# Patient Record
Sex: Male | Born: 2000 | Race: White | Hispanic: Yes | Marital: Single | Smoking: Never smoker
Health system: Southern US, Community
[De-identification: ages and names within clinical notes are randomized; demographics above are authoritative.]

## PROBLEM LIST (undated history)

## (undated) HISTORY — PX: APPENDECTOMY: SHX54

---
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
## 2022-08-28 ENCOUNTER — Other Ambulatory Visit: Payer: Self-pay | Admitting: Internal Medicine

## 2022-08-28 DIAGNOSIS — R29898 Other symptoms and signs involving the musculoskeletal system: Secondary | ICD-10-CM

## 2022-09-02 ENCOUNTER — Encounter: Payer: Self-pay | Admitting: Family Medicine

## 2022-09-02 NOTE — Progress Notes (Signed)
Error  Trying to get Video visit with [facility] health care after a visit with them last week.  Will call them to get set up.  Patient acknowledged agreement and understanding of the plan.

## 2022-09-17 ENCOUNTER — Other Ambulatory Visit: Payer: Self-pay | Admitting: Internal Medicine

## 2022-09-17 ENCOUNTER — Ambulatory Visit
Admission: RE | Admit: 2022-09-17 | Discharge: 2022-09-17 | Disposition: A | Discharge status: Home / Self Care | Payer: Worker's Compensation | Source: Ambulatory Visit | Attending: Internal Medicine | Admitting: Internal Medicine

## 2022-09-17 ENCOUNTER — Ambulatory Visit
Admission: RE | Admit: 2022-09-17 | Discharge: 2022-09-17 | Disposition: A | Discharge status: Home / Self Care | Payer: MEDICAID | Source: Ambulatory Visit | Attending: Internal Medicine | Admitting: Internal Medicine

## 2022-09-17 DIAGNOSIS — R29898 Other symptoms and signs involving the musculoskeletal system: Secondary | ICD-10-CM

## 2022-09-17 DIAGNOSIS — S00459A Superficial foreign body of unspecified ear, initial encounter: Secondary | ICD-10-CM

## 2022-09-17 MED ORDER — GADOPICLENOL 0.5 MMOL/ML IV SOLN
5.5000 mL | Freq: Once | INTRAVENOUS | Status: AC | PRN
  Administered 2022-09-17: 5.5 mL via INTRAVENOUS

## 2022-10-08 ENCOUNTER — Telehealth: Payer: Self-pay | Admitting: Family Medicine

## 2022-10-08 DIAGNOSIS — R29898 Other symptoms and signs involving the musculoskeletal system: Secondary | ICD-10-CM

## 2022-10-08 NOTE — Progress Notes (Signed)
Manuel [name]   Is followed by [facility] and was trying to make appt with them. Will reach out to them, numbers in chart given  Patient acknowledged agreement and understanding of the plan.

## 2022-11-04 ENCOUNTER — Other Ambulatory Visit: Payer: Self-pay | Admitting: Neurological Surgery

## 2022-11-04 DIAGNOSIS — G950 Syringomyelia and syringobulbia: Secondary | ICD-10-CM

## 2022-11-11 ENCOUNTER — Other Ambulatory Visit: Payer: Self-pay | Admitting: Orthopedic Surgery

## 2022-11-11 DIAGNOSIS — S43005S Unspecified dislocation of left shoulder joint, sequela: Secondary | ICD-10-CM

## 2022-11-20 ENCOUNTER — Ambulatory Visit
Admission: RE | Admit: 2022-11-20 | Discharge: 2022-11-20 | Disposition: A | Discharge status: Home / Self Care | Payer: Worker's Compensation | Source: Ambulatory Visit | Attending: Neurological Surgery | Admitting: Neurological Surgery

## 2022-11-20 DIAGNOSIS — G950 Syringomyelia and syringobulbia: Secondary | ICD-10-CM

## 2022-11-20 MED ORDER — GADOPICLENOL 0.5 MMOL/ML IV SOLN
7.5000 mL | Freq: Once | INTRAVENOUS | Status: AC | PRN
  Administered 2022-11-20: 5 mL via INTRAVENOUS

## 2022-11-21 ENCOUNTER — Ambulatory Visit
Admission: RE | Admit: 2022-11-21 | Discharge: 2022-11-21 | Disposition: A | Discharge status: Home / Self Care | Payer: Worker's Compensation | Source: Ambulatory Visit | Attending: Orthopedic Surgery | Admitting: Orthopedic Surgery

## 2022-11-21 DIAGNOSIS — S43005S Unspecified dislocation of left shoulder joint, sequela: Secondary | ICD-10-CM

## 2023-01-09 ENCOUNTER — Other Ambulatory Visit: Payer: Self-pay

## 2023-01-09 ENCOUNTER — Emergency Department
Admission: EM | Admit: 2023-01-09 | Discharge: 2023-01-09 | Disposition: A | Discharge status: Home / Self Care | Payer: MEDICAID | Attending: Emergency Medicine | Admitting: Emergency Medicine

## 2023-01-09 ENCOUNTER — Emergency Department: Payer: MEDICAID

## 2023-01-09 ENCOUNTER — Encounter: Payer: Self-pay | Admitting: Emergency Medicine

## 2023-01-09 DIAGNOSIS — R0789 Other chest pain: Secondary | ICD-10-CM | POA: Diagnosis present

## 2023-01-09 DIAGNOSIS — J9801 Acute bronchospasm: Secondary | ICD-10-CM | POA: Diagnosis not present

## 2023-01-09 LAB — BASIC METABOLIC PANEL
Anion gap: 9 (ref 5–15)
BUN: 10 mg/dL (ref 6–20)
CO2: 26 mmol/L (ref 22–32)
Calcium: 9 mg/dL (ref 8.9–10.3)
Chloride: 103 mmol/L (ref 98–111)
Creatinine, Ser: 0.74 mg/dL (ref 0.61–1.24)
GFR, Estimated: >60 mL/min (ref >60)
Glucose, Bld: 97 mg/dL (ref 70–99)
Potassium: 3.7 mmol/L (ref 3.5–5.1)
Sodium: 138 mmol/L (ref 135–145)

## 2023-01-09 LAB — CBC
HCT: 44.6 % (ref 39.0–52.0)
Hemoglobin: 15.1 g/dL (ref 13.0–17.0)
MCH: 32.1 pg (ref 26.0–34.0)
MCHC: 33.9 g/dL (ref 30.0–36.0)
MCV: 94.7 fL (ref 80.0–100.0)
Platelets: 155 10*3/uL (ref 150–400)
RBC: 4.71 MIL/uL (ref 4.22–5.81)
RDW: 11.9 % (ref 11.5–15.5)
WBC: 5.1 10*3/uL (ref 4.0–10.5)
nRBC: 0 % (ref 0.0–0.2)

## 2023-01-09 LAB — TROPONIN I (HIGH SENSITIVITY): Troponin I (High Sensitivity): 2 ng/L (ref <18)

## 2023-01-09 MED ORDER — IPRATROPIUM-ALBUTEROL 0.5-2.5 (3) MG/3ML IN SOLN
3.0000 mL | Freq: Once | RESPIRATORY_TRACT | Status: AC
  Administered 2023-01-09: 3 mL via RESPIRATORY_TRACT
  Filled 2023-01-09: qty 3

## 2023-01-09 MED ORDER — ALBUTEROL SULFATE HFA 108 (90 BASE) MCG/ACT IN AERS: 2.0000 | INHALATION_SPRAY | Freq: Four times a day (QID) | RESPIRATORY_TRACT | 2 refills | Status: AC | PRN
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
## 2023-01-09 NOTE — ED Triage Notes (Signed)
Patient to ED via POV for CP and SOB. Started yesterday. States he is unsure if its from stress or anxiety. Denies cardiac hx. Denies pain at this time.

## 2023-08-04 ENCOUNTER — Other Ambulatory Visit: Payer: Self-pay

## 2023-08-04 ENCOUNTER — Emergency Department
Admission: EM | Admit: 2023-08-04 | Discharge: 2023-08-04 | Discharge status: Against Medical Advice | Payer: MEDICAID | Attending: Emergency Medicine | Admitting: Emergency Medicine

## 2023-08-04 DIAGNOSIS — M25552 Pain in left hip: Secondary | ICD-10-CM | POA: Insufficient documentation

## 2023-08-04 DIAGNOSIS — Z5321 Procedure and treatment not carried out due to patient leaving prior to being seen by health care provider: Secondary | ICD-10-CM | POA: Diagnosis not present

## 2023-08-04 DIAGNOSIS — R42 Dizziness and giddiness: Secondary | ICD-10-CM | POA: Diagnosis present

## 2023-08-04 LAB — BASIC METABOLIC PANEL WITH GFR
Anion gap: 8 (ref 5–15)
BUN: 13 mg/dL (ref 6–20)
CO2: 25 mmol/L (ref 22–32)
Calcium: 8.9 mg/dL (ref 8.9–10.3)
Chloride: 105 mmol/L (ref 98–111)
Creatinine, Ser: 0.73 mg/dL (ref 0.61–1.24)
GFR, Estimated: >60 mL/min (ref >60)
Glucose, Bld: 113 mg/dL — ABNORMAL HIGH (ref 70–99)
Potassium: 3.6 mmol/L (ref 3.5–5.1)
Sodium: 138 mmol/L (ref 135–145)

## 2023-08-04 LAB — CBC
HCT: 42.2 % (ref 39.0–52.0)
Hemoglobin: 14.4 g/dL (ref 13.0–17.0)
MCH: 32.5 pg (ref 26.0–34.0)
MCHC: 34.1 g/dL (ref 30.0–36.0)
MCV: 95.3 fL (ref 80.0–100.0)
Platelets: 139 10*3/uL — ABNORMAL LOW (ref 150–400)
RBC: 4.43 MIL/uL (ref 4.22–5.81)
RDW: 12.1 % (ref 11.5–15.5)
WBC: 6.9 10*3/uL (ref 4.0–10.5)
nRBC: 0 % (ref 0.0–0.2)

## 2023-08-04 NOTE — ED Notes (Signed)
 Pt reports he is leaving due to wait time. Pt ambulated out of department with family.
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
## 2023-12-01 ENCOUNTER — Emergency Department
Admission: EM | Admit: 2023-12-01 | Discharge: 2023-12-01 | Disposition: A | Discharge status: Home / Self Care | Payer: MEDICAID | Attending: Emergency Medicine | Admitting: Emergency Medicine

## 2023-12-01 ENCOUNTER — Encounter: Payer: Self-pay | Admitting: *Deleted

## 2023-12-01 ENCOUNTER — Emergency Department: Payer: MEDICAID

## 2023-12-01 ENCOUNTER — Other Ambulatory Visit: Payer: Self-pay

## 2023-12-01 DIAGNOSIS — R0602 Shortness of breath: Secondary | ICD-10-CM | POA: Diagnosis present

## 2023-12-01 DIAGNOSIS — D72829 Elevated white blood cell count, unspecified: Secondary | ICD-10-CM | POA: Diagnosis not present

## 2023-12-01 LAB — TROPONIN I (HIGH SENSITIVITY)
Troponin I (High Sensitivity): 3 ng/L (ref <18)
Troponin I (High Sensitivity): 3 ng/L (ref <18)

## 2023-12-01 LAB — BASIC METABOLIC PANEL WITH GFR
Anion gap: 13 (ref 5–15)
BUN: 15 mg/dL (ref 6–20)
CO2: 24 mmol/L (ref 22–32)
Calcium: 9.2 mg/dL (ref 8.9–10.3)
Chloride: 100 mmol/L (ref 98–111)
Creatinine, Ser: 0.74 mg/dL (ref 0.61–1.24)
GFR, Estimated: >60 mL/min (ref >60)
Glucose, Bld: 130 mg/dL — ABNORMAL HIGH (ref 70–99)
Potassium: 3.6 mmol/L (ref 3.5–5.1)
Sodium: 137 mmol/L (ref 135–145)

## 2023-12-01 LAB — CBC
HCT: 47.3 % (ref 39.0–52.0)
Hemoglobin: 16.5 g/dL (ref 13.0–17.0)
MCH: 33.1 pg (ref 26.0–34.0)
MCHC: 34.9 g/dL (ref 30.0–36.0)
MCV: 94.8 fL (ref 80.0–100.0)
Platelets: 179 K/uL (ref 150–400)
RBC: 4.99 MIL/uL (ref 4.22–5.81)
RDW: 11.7 % (ref 11.5–15.5)
WBC: 10.6 K/uL — ABNORMAL HIGH (ref 4.0–10.5)
nRBC: 0 % (ref 0.0–0.2)

## 2023-12-01 LAB — D-DIMER, QUANTITATIVE: D-Dimer, Quant: 0.3 ug{FEU}/mL (ref 0.00–0.50)

## 2023-12-01 MED ORDER — ALBUTEROL SULFATE HFA 108 (90 BASE) MCG/ACT IN AERS: 2.0000 | INHALATION_SPRAY | Freq: Four times a day (QID) | RESPIRATORY_TRACT | 0 refills | Status: AC | PRN

## 2023-12-01 MED ORDER — IPRATROPIUM-ALBUTEROL 0.5-2.5 (3) MG/3ML IN SOLN
3.0000 mL | Freq: Once | RESPIRATORY_TRACT | Status: AC
  Administered 2023-12-01: 3 mL via RESPIRATORY_TRACT
  Filled 2023-12-01: qty 3

## 2023-12-01 MED ORDER — PREDNISONE 20 MG PO TABS: 40.0000 mg | ORAL_TABLET | Freq: Every day | ORAL | 0 refills | Status: AC

## 2023-12-01 MED ORDER — PREDNISONE 20 MG PO TABS
40.0000 mg | ORAL_TABLET | Freq: Once | ORAL | Status: AC
  Administered 2023-12-01: 40 mg via ORAL
  Filled 2023-12-01: qty 2

## 2023-12-01 MED ORDER — SODIUM CHLORIDE 0.9 % IV BOLUS
1000.0000 mL | Freq: Once | INTRAVENOUS | Status: AC
  Administered 2023-12-01: 1000 mL via INTRAVENOUS

## 2023-12-01 NOTE — Discharge Instructions (Addendum)
 You were seen in the emergency department today for evaluation of your shortness of breath.  Your testing fortunately did not show an emergency cause for this.  You did have improvement with the breathing treatment here.  Please follow-up with a primary care doctor for further evaluation of possible underlying lung diseases such as asthma.  I sent a short course of steroids to your pharmacy to help with your symptoms.  Return to the ER for any new or worsening symptoms.

## 2023-12-01 NOTE — ED Triage Notes (Addendum)
 Pt ambulatory to triage. Pt reports sob x 3 days.  Pt using inhaler without relief.   Pt used inhaler at 1830.  No n/v  no chest pain.  Pt reports pain when taking a deep breath.  Non smoker.   Pt alert.

## 2023-12-01 NOTE — ED Provider Notes (Signed)
 [facility] Provider Note    Event Date/Time   First MD Initiated Contact with Patient 12/01/23 2042     (approximate)   History   Shortness of Breath   HPI  Manuel [name] is a 23 year old male presenting to the emergency department for evaluation of shortness of breath.  Reports a history of a similar episode a few years ago at which time he was prescribed an inhaler.  Has been taking this with limited benefit.  No chest pain.  Does report the symptoms are worse when he takes a deep breath.  Reviewed ER visit from 01/09/2023.  Presented with chest tightness.  Received several rounds of DuoNeb treatment with improvement.  Discharged with suspected bronchospasm with albuterol  inhaler.     Physical Exam   Triage Vital Signs: ED Triage Vitals  Encounter Vitals Group     BP 12/01/23 1933 (!) 118/94     Girls Systolic BP Percentile --      Girls Diastolic BP Percentile --      Boys Systolic BP Percentile --      Boys Diastolic BP Percentile --      Pulse Rate 12/01/23 1933 (!) 129     Resp 12/01/23 1933 20     Temp 12/01/23 1933 99 F (37.2 C)     Temp Source 12/01/23 1933 Oral     SpO2 12/01/23 1933 100 %     Weight 12/01/23 1930 180 lb (81.6 kg)     Height 12/01/23 1930 5' 6 (1.676 m)     Head Circumference --      Peak Flow --      Pain Score 12/01/23 1930 0     Pain Loc --      Pain Education --      Exclude from Growth Chart --     Most recent vital signs: Vitals:   12/01/23 1933 12/01/23 2218  BP: (!) 118/94 139/86  Pulse: (!) 129 92  Resp: 20 18  Temp: 99 F (37.2 C)   SpO2: 100% 98%     General: Awake, interactive  CV:  Good peripheral perfusion, tachycardic Resp:  Unlabored respirations, lungs mildly diminished bilaterally without frank wheezing Abd:  Nondistended.  Neuro:  Symmetric facial movement, fluid speech   ED Results / Procedures / Treatments   Labs (all labs ordered are listed, but only abnormal results  are displayed) Labs Reviewed  BASIC METABOLIC PANEL WITH GFR - Abnormal; Notable for the following components:      Result Value   Glucose, Bld 130 (*)    All other components within normal limits  CBC - Abnormal; Notable for the following components:   WBC 10.6 (*)    All other components within normal limits  D-DIMER, QUANTITATIVE  TROPONIN I (HIGH SENSITIVITY)  TROPONIN I (HIGH SENSITIVITY)     EKG EKG independently reviewed and interpreted by myself demonstrates:  EKG demonstrates sinus tachycardia rate 125, PR 148, cures 78, QTc 404, nonspecific ST changes, no STEMI  RADIOLOGY Imaging independently reviewed and interpreted by myself demonstrates:  CXR without focal consolidation  Formal Radiology Read:  DG Chest 2 View Result Date: 12/01/2023 EXAM: 2 VIEW(S) XRAY OF THE CHEST 12/01/2023 07:51:00 PM COMPARISON: 01/09/2023 CLINICAL HISTORY: sob. SOB x 3 days. Pt using inhaler without relief. States slight dizziness and difficulty taking deep breath without pain. Shaking observed. FINDINGS: LUNGS AND PLEURA: No focal pulmonary opacity. No pulmonary edema. No pleural effusion. No pneumothorax. HEART AND  MEDIASTINUM: No acute abnormality of the cardiac and mediastinal silhouettes. BONES AND SOFT TISSUES: No acute osseous abnormality. IMPRESSION: 1. No acute cardiopulmonary process. Electronically signed by: Pinkie Pebbles MD 12/01/2023 08:06 PM EDT RP Workstation: [id number]    PROCEDURES:  Critical Care performed: No  Procedures   MEDICATIONS ORDERED IN ED: Medications  predniSONE (DELTASONE) tablet 40 mg (has no administration in time range)  sodium chloride 0.9 % bolus 1,000 mL (0 mLs Intravenous Stopped 12/01/23 2205)  ipratropium-albuterol  (DUONEB) 0.5-2.5 (3) MG/3ML nebulizer solution 3 mL (3 mLs Nebulization Given 12/01/23 2127)     IMPRESSION / MDM / ASSESSMENT AND PLAN / ED COURSE  I reviewed the triage vital signs and the nursing notes.  Differential  diagnosis includes, but is not limited to, reactive airway disease, pneumonia, pneumothorax, PE, much lower suspicion ACS  Patient's presentation is most consistent with acute presentation with potential threat to life or bodily function.  23 year old male presenting with pleuritic chest pain.  Tachycardic on presentation.  Labs with mild leukocytosis, reassuring BMP.  Negative troponin and well over 3 hours of symptoms.  Negative D-dimer.  X-[place]dee Fleece reassuring.  EKG without acute ischemic changes.  History of suspected bronchospasm improved with DuoNeb treatment.  Given fluids and DuoNebs here with significant improvement in subjective breathing as well as improved air movement on auscultation.  Heart rate improved to 90s after fluids.  Suspect likely trigger for potential underlying reactive airway disease.  Discussed outpatient follow-up for further evaluation including evaluation for possible underlying asthma.  Already has an albuterol  inhaler.  Will DC with short course of steroids.  Strict return precautions provided.  Patient discharged in stable condition.      FINAL CLINICAL IMPRESSION(S) / ED DIAGNOSES   Final diagnoses:  Shortness of breath     Rx / DC Orders   ED Discharge Orders          Ordered    predniSONE (DELTASONE) 20 MG tablet  Daily with breakfast        12/01/23 2220             Note:  This document was prepared using Dragon voice recognition software and may include unintentional dictation errors.   Levander Slate, MD 12/01/23 2220
